# Patient Record
Sex: Male | Born: 1980 | Race: White | Hispanic: Yes | Marital: Married | Smoking: Never smoker
Health system: Southern US, Community
[De-identification: ages and names within clinical notes are randomized; demographics above are authoritative.]

## PROBLEM LIST (undated history)

## (undated) DIAGNOSIS — R7611 Nonspecific reaction to tuberculin skin test without active tuberculosis: Secondary | ICD-10-CM

## (undated) DIAGNOSIS — K219 Gastro-esophageal reflux disease without esophagitis: Secondary | ICD-10-CM

## (undated) DIAGNOSIS — T7840XA Allergy, unspecified, initial encounter: Secondary | ICD-10-CM

## (undated) HISTORY — DX: Allergy, unspecified, initial encounter: T78.40XA

## (undated) HISTORY — DX: Nonspecific reaction to tuberculin skin test without active tuberculosis: R76.11

## (undated) HISTORY — DX: Gastro-esophageal reflux disease without esophagitis: K21.9

---

## 2015-08-08 ENCOUNTER — Ambulatory Visit (INDEPENDENT_AMBULATORY_CARE_PROVIDER_SITE_OTHER): Payer: Managed Care, Other (non HMO) | Admitting: Family Medicine

## 2015-08-08 ENCOUNTER — Encounter: Payer: Self-pay | Admitting: Family Medicine

## 2015-08-08 VITALS — BP 122/74 | HR 76 | Temp 97.9°F | Ht 66.0 in | Wt 199.5 lb

## 2015-08-08 DIAGNOSIS — Z7189 Other specified counseling: Secondary | ICD-10-CM

## 2015-08-08 DIAGNOSIS — Z0000 Encounter for general adult medical examination without abnormal findings: Secondary | ICD-10-CM

## 2015-08-08 NOTE — Patient Instructions (Signed)
We'll get your old records.  Keep working on diet and exercise.  Let me know if you want to get an HIV screen or flu shot.  Take care.  Glad to see you.

## 2015-08-08 NOTE — Progress Notes (Addendum)
Pre visit review using our clinic review tool, if applicable. No additional management support is needed unless otherwise documented below in the visit note.  New patient.  Requesting records.  Feels well.  CPE- See plan.  Routine anticipatory guidance given to patient.  See health maintenance. Tetanus ~2012 per patient report Flu encouraged.  HIV screening encouraged. PNA and shingles not due Colon and prostate cancer screening not due Living will d/w pt.  Wife designated if patient were incapacitated.   Diet and exercise d/w pt. Doing well with both.  Encouraged.   Prev with lipids checked, await records.    PMH and SH reviewed  Meds, vitals, and allergies reviewed.   ROS: See HPI.  He has no complaints.  No FCNAV.  ROS Otherwise negative.    GEN: nad, alert and oriented HEENT: mucous membranes moist NECK: supple w/o LA CV: rrr. PULM: ctab, no inc wob ABD: soft, +bs EXT: no edema SKIN: no acute rash

## 2015-08-09 ENCOUNTER — Encounter: Payer: Self-pay | Admitting: Family Medicine

## 2015-08-09 DIAGNOSIS — Z0000 Encounter for general adult medical examination without abnormal findings: Secondary | ICD-10-CM | POA: Insufficient documentation

## 2015-08-09 DIAGNOSIS — Z7189 Other specified counseling: Secondary | ICD-10-CM | POA: Insufficient documentation

## 2015-08-09 NOTE — Assessment & Plan Note (Signed)
Tetanus ~2012 per patient report Flu encouraged.  HIV screening encouraged. PNA and shingles not due Colon and prostate cancer screening not due Living will d/w pt.  Wife designated if patient were incapacitated.   Diet and exercise d/w pt. Doing well with both.  Encouraged.   Prev with lipids checked, await records.

## 2016-07-30 ENCOUNTER — Ambulatory Visit (INDEPENDENT_AMBULATORY_CARE_PROVIDER_SITE_OTHER): Payer: Managed Care, Other (non HMO) | Admitting: Family Medicine

## 2016-07-30 ENCOUNTER — Encounter: Payer: Self-pay | Admitting: Family Medicine

## 2016-07-30 VITALS — BP 110/80 | HR 65 | Temp 98.5°F | Ht 66.0 in | Wt 190.5 lb

## 2016-07-30 DIAGNOSIS — Z131 Encounter for screening for diabetes mellitus: Secondary | ICD-10-CM

## 2016-07-30 DIAGNOSIS — Z8249 Family history of ischemic heart disease and other diseases of the circulatory system: Secondary | ICD-10-CM | POA: Diagnosis not present

## 2016-07-30 DIAGNOSIS — Z0000 Encounter for general adult medical examination without abnormal findings: Secondary | ICD-10-CM | POA: Diagnosis not present

## 2016-07-30 LAB — LIPID PANEL
CHOLESTEROL: 209 mg/dL — AB (ref 0–200)
HDL: 45.7 mg/dL (ref >39.00)
LDL Cholesterol: 140 mg/dL — ABNORMAL HIGH (ref 0–99)
NonHDL: 163.71
Total CHOL/HDL Ratio: 5
Triglycerides: 117 mg/dL (ref 0.0–149.0)
VLDL: 23.4 mg/dL (ref 0.0–40.0)

## 2016-07-30 LAB — GLUCOSE, RANDOM: GLUCOSE: 91 mg/dL (ref 70–99)

## 2016-07-30 NOTE — Progress Notes (Signed)
Pre visit review using our clinic review tool, if applicable. No additional management support is needed unless otherwise documented below in the visit note. 

## 2016-07-30 NOTE — Progress Notes (Signed)
CPE- See plan.  Routine anticipatory guidance given to patient.  See health maintenance. Tetanus ~2012 per patient report Flu encouraged.  To be done at work.  [facility] PNA and shingles not due Colon and prostate cancer screening not due Living will d/w pt.  Wife designated if patient were incapacitated.   Diet and exercise d/w pt. Doing well with both.  Encouraged both.   He has had some occ swelling on the L 5th finger w/o injury.  No bruising or redness prev.  No sx now.  He has h/o some joint laxity at baseline with baseline ability to dorsiflex 5th digits >90 deg, ie past vertical and h/o recurrently ankle sprains in childhood.  No hand sx currently.   PMH and SH reviewed  Meds, vitals, and allergies reviewed.   ROS: Per HPI.  Unless specifically indicated otherwise in HPI, the patient denies:  General: fever. Eyes: acute vision changes ENT: sore throat Cardiovascular: chest pain Respiratory: SOB GI: vomiting GU: dysuria Musculoskeletal: acute back pain Derm: acute rash Neuro: acute motor dysfunction Psych: worsening mood Endocrine: polydipsia Heme: bleeding Allergy: hayfever  GEN: nad, alert and oriented HEENT: mucous membranes moist NECK: supple w/o LA CV: rrr. PULM: ctab, no inc wob ABD: soft, +bs EXT: no edema SKIN: no acute rash L hand with normal inspection, no tendon deficit on testing ROM of the fingers.  Normal sensation.  No bruising, no swelling, no redness.  Hand/fingers not ttp

## 2016-07-30 NOTE — Patient Instructions (Signed)
Go to the lab on the way out.  We'll contact you with your lab report. Ask about getting a flu shot at work.  Take care.  Glad to see you.

## 2016-07-30 NOTE — Assessment & Plan Note (Signed)
Tetanus ~2012 per patient report Flu encouraged.  To be done at work.  [facility] screening prev done [facility] ~[facility] PNA and shingles not due Colon and prostate cancer screening not due Living will d/w pt.  Wife designated if patient were incapacitated.   Diet and exercise d/w pt. Doing well with both.  Encouraged both.  Reasonable to recheck lipid and sugar today, given FH CAD and diabetic screening respectively.  dw pt.  See notes on labs.    He may have some tendency to MCP or IP swelling vs irritation due to baseline joint/tendon laxity.  D/w pt.  W/o sx at this point, wouldn't intervene.  He can update me as needed.

## 2016-08-01 ENCOUNTER — Encounter: Payer: Self-pay | Admitting: *Deleted

## 2017-07-28 ENCOUNTER — Encounter: Payer: Self-pay | Admitting: Family Medicine

## 2017-07-28 ENCOUNTER — Ambulatory Visit (INDEPENDENT_AMBULATORY_CARE_PROVIDER_SITE_OTHER): Payer: Managed Care, Other (non HMO) | Admitting: Family Medicine

## 2017-07-28 VITALS — BP 122/80 | HR 67 | Temp 98.4°F | Ht 66.0 in | Wt 193.8 lb

## 2017-07-28 DIAGNOSIS — Z8249 Family history of ischemic heart disease and other diseases of the circulatory system: Secondary | ICD-10-CM | POA: Diagnosis not present

## 2017-07-28 DIAGNOSIS — Z0000 Encounter for general adult medical examination without abnormal findings: Secondary | ICD-10-CM

## 2017-07-28 DIAGNOSIS — Z131 Encounter for screening for diabetes mellitus: Secondary | ICD-10-CM

## 2017-07-28 DIAGNOSIS — Z7189 Other specified counseling: Secondary | ICD-10-CM

## 2017-07-28 LAB — GLUCOSE, RANDOM: GLUCOSE: 91 mg/dL (ref 70–99)

## 2017-07-28 LAB — LIPID PANEL
Cholesterol: 224 mg/dL — ABNORMAL HIGH (ref 0–200)
HDL: 38.9 mg/dL — AB (ref >39.00)
NONHDL: 185.55
Total CHOL/HDL Ratio: 6
Triglycerides: 228 mg/dL — ABNORMAL HIGH (ref 0.0–149.0)
VLDL: 45.6 mg/dL — AB (ref 0.0–40.0)

## 2017-07-28 LAB — LDL CHOLESTEROL, DIRECT: Direct LDL: 158 mg/dL

## 2017-07-28 NOTE — Progress Notes (Signed)
CPE- See plan.  Routine anticipatory guidance given to patient.  See health maintenance.  The possibility exists that previously documented standard health maintenance information may have been brought forward from a previous encounter into this note.  If needed, that same information has been updated to reflect the current situation based on today's encounter.    Tetanus ~2012 per patient report. Flu encouraged to be done at work.  [facility] screening prev done [facility] ~[facility] PNA and shingles not due Colon and prostate cancer screening not due Living will d/w pt. Wife designated if patient were incapacitated.  Diet and exercise d/w pt. Encouraged both.   Reasonable to recheck lipid and sugar today, given FH CAD and diabetic screening respectively.  dw pt.  See notes on labs.    PMH and SH reviewed  Meds, vitals, and allergies reviewed.   ROS: Per HPI.  Unless specifically indicated otherwise in HPI, the patient denies:  General: fever. Eyes: acute vision changes ENT: sore throat Cardiovascular: chest pain Respiratory: SOB GI: vomiting GU: dysuria Musculoskeletal: acute back pain Derm: acute rash Neuro: acute motor dysfunction Psych: worsening mood Endocrine: polydipsia Heme: bleeding Allergy: hayfever  GEN: nad, alert and oriented HEENT: mucous membranes moist NECK: supple w/o LA CV: rrr. PULM: ctab, no inc wob ABD: soft, +bs EXT: no edema SKIN: no acute rash, benign appearing nevi on the trunk.

## 2017-07-28 NOTE — Patient Instructions (Signed)
Go to the lab on the way out.  We'll contact you with your lab report. Get a flu shot at work.  Take care.  Glad to see you.  Update me as needed.  Keep working on diet and exercise.

## 2017-07-29 ENCOUNTER — Telehealth: Payer: Self-pay | Admitting: Family Medicine

## 2017-07-29 NOTE — Assessment & Plan Note (Signed)
Living will d/w pt.  Wife designated if patient were incapacitated.   ?

## 2017-07-29 NOTE — Telephone Encounter (Signed)
Notify pt.  Sugar is fine.  Needs work on D&E since lipids are up and recheck next year at/before CPE.  Thanks.

## 2017-07-29 NOTE — Assessment & Plan Note (Signed)
Tetanus ~2012 per patient report. Flu encouraged to be done at work.  [facility] done [facility] ~[facility] PNA and shingles not due Colon and prostate cancer screening not due Living will d/w pt. Wife designated if patient were incapacitated.  Diet and exercise d/w pt. Encouraged both.   Reasonable to recheck lipid and sugar today, given FH CAD and diabetic screening respectively.  dw pt.  See notes on labs.

## 2017-07-29 NOTE — Telephone Encounter (Signed)
VM has not been set up yet.  Try again later.

## 2017-08-01 NOTE — Telephone Encounter (Signed)
Patient advised.

## 2018-08-04 ENCOUNTER — Ambulatory Visit (INDEPENDENT_AMBULATORY_CARE_PROVIDER_SITE_OTHER): Payer: Managed Care, Other (non HMO) | Admitting: Family Medicine

## 2018-08-04 ENCOUNTER — Encounter: Payer: Self-pay | Admitting: Family Medicine

## 2018-08-04 VITALS — BP 118/84 | HR 70 | Temp 98.1°F | Ht 66.0 in | Wt 193.0 lb

## 2018-08-04 DIAGNOSIS — Z0000 Encounter for general adult medical examination without abnormal findings: Secondary | ICD-10-CM | POA: Diagnosis not present

## 2018-08-04 DIAGNOSIS — E785 Hyperlipidemia, unspecified: Secondary | ICD-10-CM

## 2018-08-04 DIAGNOSIS — Z7189 Other specified counseling: Secondary | ICD-10-CM

## 2018-08-04 DIAGNOSIS — Z131 Encounter for screening for diabetes mellitus: Secondary | ICD-10-CM | POA: Diagnosis not present

## 2018-08-04 LAB — LIPID PANEL
Cholesterol: 218 mg/dL — ABNORMAL HIGH (ref 0–200)
HDL: 38.4 mg/dL — AB (ref >39.00)
LDL Cholesterol: 140 mg/dL — ABNORMAL HIGH (ref 0–99)
NonHDL: 179.46
Total CHOL/HDL Ratio: 6
Triglycerides: 197 mg/dL — ABNORMAL HIGH (ref 0.0–149.0)
VLDL: 39.4 mg/dL (ref 0.0–40.0)

## 2018-08-04 LAB — GLUCOSE, RANDOM: Glucose, Bld: 97 mg/dL (ref 70–99)

## 2018-08-04 NOTE — Progress Notes (Signed)
CPE- See plan.  Routine anticipatory guidance given to patient.  See health maintenance.  The possibility exists that previously documented standard health maintenance information may have been brought forward from a previous encounter into this note.  If needed, that same information has been updated to reflect the current situation based on today's encounter.    Tetanus ~2012 per patient report. Flu encouraged to be done at work.  [facility] screening prev done [facility] ~[facility] PNA and shingles not due Colon and prostate cancer screening not due Living will d/w pt. Wife designated if patient were incapacitated.  Diet and exercise d/w pt.Encouraged both, d/w pt.    Reasonable to recheck lipid and sugar today, given FH CAD and diabetic screening respectively. dw pt. See notes on labs. h/o HLD noted.    Some heartburn noted, in the epigastrum.  He had occ sx.  D/w pt about diet and exercise, better with diet changes.  Exercise helps.  Discussed his work schedule, trying to maintain a healthy worklife balance.  PMH and SH reviewed  Meds, vitals, and allergies reviewed.   ROS: Per HPI.  Unless specifically indicated otherwise in HPI, the patient denies:  General: fever. Eyes: acute vision changes ENT: sore throat Cardiovascular: chest pain Respiratory: SOB GI: vomiting GU: dysuria Musculoskeletal: acute back pain Derm: acute rash Neuro: acute motor dysfunction Psych: worsening mood Endocrine: polydipsia Heme: bleeding Allergy: hayfever  GEN: nad, alert and oriented HEENT: mucous membranes moist NECK: supple w/o LA CV: rrr. PULM: ctab, no inc wob ABD: soft, +bs EXT: no edema SKIN: no acute rash

## 2018-08-04 NOTE — Patient Instructions (Addendum)
Go to the lab on the way out.  We'll contact you with your lab report. I would get a flu shot each fall.   Keep working on diet and exercise.  Take care.  Glad to see you.  Update me as needed.

## 2018-08-05 NOTE — Assessment & Plan Note (Signed)
Tetanus ~2012 per patient report. Flu encouraged to be done at work.  [facility] screening prev done [facility] ~[facility] PNA and shingles not due Colon and prostate cancer screening not due Living will d/w pt. Wife designated if patient were incapacitated.  Diet and exercise d/w pt.Encouraged both, d/w pt.    Reasonable to recheck lipid and sugar today, given FH CAD and diabetic screening respectively. dw pt. See notes on labs. h/o HLD noted.

## 2018-08-05 NOTE — Assessment & Plan Note (Signed)
Living will d/w pt.  Wife designated if patient were incapacitated.   ?

## 2019-08-04 ENCOUNTER — Other Ambulatory Visit: Payer: Self-pay | Admitting: Family Medicine

## 2019-08-04 ENCOUNTER — Telehealth: Payer: Self-pay

## 2019-08-04 DIAGNOSIS — E785 Hyperlipidemia, unspecified: Secondary | ICD-10-CM

## 2019-08-04 NOTE — Telephone Encounter (Signed)
LVM w COVID screen, front door, back lab info 12.2.2020 TLJ

## 2019-08-06 ENCOUNTER — Other Ambulatory Visit: Payer: Self-pay

## 2019-08-06 ENCOUNTER — Other Ambulatory Visit (INDEPENDENT_AMBULATORY_CARE_PROVIDER_SITE_OTHER): Payer: Managed Care, Other (non HMO)

## 2019-08-06 DIAGNOSIS — E785 Hyperlipidemia, unspecified: Secondary | ICD-10-CM | POA: Diagnosis not present

## 2019-08-06 LAB — LIPID PANEL
Cholesterol: 232 mg/dL — ABNORMAL HIGH (ref 0–200)
HDL: 42.9 mg/dL (ref >39.00)
LDL Cholesterol: 165 mg/dL — ABNORMAL HIGH (ref 0–99)
NonHDL: 188.76
Total CHOL/HDL Ratio: 5
Triglycerides: 117 mg/dL (ref 0.0–149.0)
VLDL: 23.4 mg/dL (ref 0.0–40.0)

## 2019-08-06 LAB — GLUCOSE, RANDOM: Glucose, Bld: 98 mg/dL (ref 70–99)

## 2019-08-12 ENCOUNTER — Encounter: Payer: Managed Care, Other (non HMO) | Admitting: Family Medicine

## 2019-08-19 ENCOUNTER — Ambulatory Visit (INDEPENDENT_AMBULATORY_CARE_PROVIDER_SITE_OTHER): Payer: Managed Care, Other (non HMO) | Admitting: Family Medicine

## 2019-08-19 ENCOUNTER — Other Ambulatory Visit: Payer: Self-pay

## 2019-08-19 ENCOUNTER — Encounter: Payer: Self-pay | Admitting: Family Medicine

## 2019-08-19 VITALS — BP 102/70 | HR 84 | Temp 97.1°F | Ht 66.0 in | Wt 189.4 lb

## 2019-08-19 DIAGNOSIS — Z7189 Other specified counseling: Secondary | ICD-10-CM

## 2019-08-19 DIAGNOSIS — Z0000 Encounter for general adult medical examination without abnormal findings: Secondary | ICD-10-CM | POA: Diagnosis not present

## 2019-08-19 NOTE — Progress Notes (Signed)
This visit occurred during the SARS-CoV-2 public health emergency.  Safety protocols were in place, including screening questions prior to the visit, additional usage of staff PPE, and extensive cleaning of exam room while observing appropriate contact time as indicated for disinfecting solutions.  CPE- See plan.  Routine anticipatory guidance given to patient.  See health maintenance.  The possibility exists that previously documented standard health maintenance information may have been brought forward from a previous encounter into this note.  If needed, that same information has been updated to reflect the current situation based on today's encounter.    Tetanus ~2012 per patient report. Flu encouraged, can be done at work.  Declined.  He does not have a medical reason not to get vaccinated for the flu and it would help protect him and his family.  Discussed. [facility] ~[facility] PNA and shingles not due Colon and prostate cancer screening not due Living will d/w pt. Wife designated if patient were incapacitated.  Diet and exercise d/w pt. Labs d/w pt.   He had some occ heartburn and RUQ discomfort rarely (a few times a year) with fatty meals but not always.  No sx currently.  We talked about options.    PMH and SH reviewed Meds, vitals, and allergies reviewed.   ROS: Per HPI.  Unless specifically indicated otherwise in HPI, the patient denies:  General: fever. Eyes: acute vision changes ENT: sore throat Cardiovascular: chest pain Respiratory: SOB GI: vomiting GU: dysuria Musculoskeletal: acute back pain Derm: acute rash Neuro: acute motor dysfunction Psych: worsening mood Endocrine: polydipsia Heme: bleeding Allergy: hayfever  GEN: nad, alert and oriented HEENT: ncat NECK: supple w/o LA CV: rrr. PULM: ctab, no inc wob ABD: soft, +bs EXT: no edema SKIN: no acute rash

## 2019-08-19 NOTE — Patient Instructions (Signed)
If you keep having abdominal pain then let me know so we can considering setting up an ultrasound.   I would get a flu shot each fall.   Take care.  Glad to see you.  Update me as needed.

## 2019-08-22 NOTE — Assessment & Plan Note (Signed)
Living will d/w pt.  Wife designated if patient were incapacitated.   ?

## 2019-08-22 NOTE — Assessment & Plan Note (Addendum)
Tetanus ~2012 per patient report. Flu encouraged, can be done at work.  Declined.  He does not have a medical reason not to get vaccinated for the flu and it would help protect him and his family.  Discussed. [facility] ~[facility] PNA and shingles not due Colon and prostate cancer screening not due Living will d/w pt. Wife designated if patient were incapacitated.  Diet and exercise d/w pt. Labs d/w pt.   We talked about his estimated ASCVD score. We can't run his typical score at his age, but assuming his current metrics but using an age of 21 to get a result, his score would be ~1%.  We both assume him to be low risk at this point, d/w pt.    He had some occ heartburn and RUQ discomfort rarely (a few times a year) with fatty meals but not always.  No sx currently.  We talked about options.  I would have him observe for now and update me if he notices recurrent symptoms.  Routine cautions given.  He agrees.  We could set up an ultrasound as an outpatient if needed.

## 2020-09-10 ENCOUNTER — Other Ambulatory Visit: Payer: Self-pay

## 2020-09-10 DIAGNOSIS — Z20822 Contact with and (suspected) exposure to covid-19: Secondary | ICD-10-CM | POA: Diagnosis not present

## 2020-09-12 LAB — SARS-COV-2, NAA 2 DAY TAT

## 2020-09-12 LAB — NOVEL CORONAVIRUS, NAA: SARS-CoV-2, NAA: DETECTED — AB

## 2020-10-02 ENCOUNTER — Encounter: Payer: Self-pay | Admitting: Family Medicine

## 2020-10-02 ENCOUNTER — Ambulatory Visit (INDEPENDENT_AMBULATORY_CARE_PROVIDER_SITE_OTHER): Payer: BC Managed Care – PPO | Admitting: Family Medicine

## 2020-10-02 ENCOUNTER — Other Ambulatory Visit: Payer: Self-pay

## 2020-10-02 VITALS — BP 104/80 | HR 79 | Temp 97.5°F | Ht 66.0 in | Wt 190.0 lb

## 2020-10-02 DIAGNOSIS — R1011 Right upper quadrant pain: Secondary | ICD-10-CM | POA: Diagnosis not present

## 2020-10-02 DIAGNOSIS — E785 Hyperlipidemia, unspecified: Secondary | ICD-10-CM

## 2020-10-02 DIAGNOSIS — Z7189 Other specified counseling: Secondary | ICD-10-CM

## 2020-10-02 DIAGNOSIS — Z0000 Encounter for general adult medical examination without abnormal findings: Secondary | ICD-10-CM | POA: Diagnosis not present

## 2020-10-02 NOTE — Patient Instructions (Signed)
Go to the lab on the way out.   If you have mychart we'll likely use that to update you.    We'll call about the ultrasound.   I would still avoid greasy/fatty foods.  Take care.  Glad to see you.

## 2020-10-02 NOTE — Progress Notes (Signed)
This visit occurred during the SARS-CoV-2 public health emergency.  Safety protocols were in place, including screening questions prior to the visit, additional usage of staff PPE, and extensive cleaning of exam room while observing appropriate contact time as indicated for disinfecting solutions.  CPE- See plan.  Routine anticipatory guidance given to patient.  See health maintenance.  The possibility exists that previously documented standard health maintenance information may have been brought forward from a previous encounter into this note.  If needed, that same information has been updated to reflect the current situation based on today's encounter.    Tetanus ~2012 per patient report. Flu encouraged.  Discussed. Covid prev done.   [facility]/HCV screening [facility] ~[facility] PNA and shingles not due Colon and prostate cancer screening not due Living will d/w pt. Wife designated if patient were incapacitated.  Diet and exercise d/w pt.  Abdominal symptoms.  No recent sx, last episode in 12/21.  RUQ.  Fatigued after the event. Can be noted after a fatty meal but not everytime after fatty meal.  We had talked about this prev with consided u/s vs observation.  Pain isn't constant, no pain now.    PMH and SH reviewed  Meds, vitals, and allergies reviewed.   ROS: Per HPI.  Unless specifically indicated otherwise in HPI, the patient denies:  General: fever. Eyes: acute vision changes ENT: sore throat Cardiovascular: chest pain Respiratory: SOB GI: vomiting GU: dysuria Musculoskeletal: acute back pain Derm: acute rash Neuro: acute motor dysfunction Psych: worsening mood Endocrine: polydipsia Heme: bleeding Allergy: hayfever  GEN: nad, alert and oriented HEENT: ncat NECK: supple w/o LA CV: rrr. PULM: ctab, no inc wob ABD: soft, +bs, not tender to palpation.  Murphy negative.  No rebound. EXT: no edema SKIN: no acute rash

## 2020-10-03 LAB — CBC WITH DIFFERENTIAL/PLATELET
Basophils Absolute: 0 10*3/uL (ref 0.0–0.1)
Basophils Relative: 0.5 % (ref 0.0–3.0)
Eosinophils Absolute: 0.2 10*3/uL (ref 0.0–0.7)
Eosinophils Relative: 3.2 % (ref 0.0–5.0)
HCT: 44 % (ref 39.0–52.0)
Hemoglobin: 15.2 g/dL (ref 13.0–17.0)
Lymphocytes Relative: 30.9 % (ref 12.0–46.0)
Lymphs Abs: 2.1 10*3/uL (ref 0.7–4.0)
MCHC: 34.4 g/dL (ref 30.0–36.0)
MCV: 86 fl (ref 78.0–100.0)
Monocytes Absolute: 0.6 10*3/uL (ref 0.1–1.0)
Monocytes Relative: 8.5 % (ref 3.0–12.0)
Neutro Abs: 3.9 10*3/uL (ref 1.4–7.7)
Neutrophils Relative %: 56.9 % (ref 43.0–77.0)
Platelets: 255 10*3/uL (ref 150.0–400.0)
RBC: 5.12 Mil/uL (ref 4.22–5.81)
RDW: 12.9 % (ref 11.5–15.5)
WBC: 6.9 10*3/uL (ref 4.0–10.5)

## 2020-10-03 LAB — COMPREHENSIVE METABOLIC PANEL
ALT: 25 U/L (ref 0–53)
AST: 21 U/L (ref 0–37)
Albumin: 4.5 g/dL (ref 3.5–5.2)
Alkaline Phosphatase: 85 U/L (ref 39–117)
BUN: 14 mg/dL (ref 6–23)
CO2: 29 mEq/L (ref 19–32)
Calcium: 9.6 mg/dL (ref 8.4–10.5)
Chloride: 101 mEq/L (ref 96–112)
Creatinine, Ser: 1.07 mg/dL (ref 0.40–1.50)
GFR: 87.77 mL/min (ref >60.00)
Glucose, Bld: 80 mg/dL (ref 70–99)
Potassium: 3.9 mEq/L (ref 3.5–5.1)
Sodium: 137 mEq/L (ref 135–145)
Total Bilirubin: 0.9 mg/dL (ref 0.2–1.2)
Total Protein: 7.3 g/dL (ref 6.0–8.3)

## 2020-10-03 LAB — LIPID PANEL
Cholesterol: 209 mg/dL — ABNORMAL HIGH (ref 0–200)
HDL: 43.4 mg/dL (ref >39.00)
LDL Cholesterol: 142 mg/dL — ABNORMAL HIGH (ref 0–99)
NonHDL: 165.46
Total CHOL/HDL Ratio: 5
Triglycerides: 115 mg/dL (ref 0.0–149.0)
VLDL: 23 mg/dL (ref 0.0–40.0)

## 2020-10-03 LAB — LIPASE: Lipase: 44 U/L (ref 11.0–59.0)

## 2020-10-04 DIAGNOSIS — R1011 Right upper quadrant pain: Secondary | ICD-10-CM | POA: Insufficient documentation

## 2020-10-04 NOTE — Assessment & Plan Note (Signed)
Living will d/w pt.  Wife designated if patient were incapacitated.   ?

## 2020-10-04 NOTE — Assessment & Plan Note (Signed)
Concern is for gallbladder pathology causing intermittent symptoms.  Would avoid fatty meals in the meantime.  Routine cautions given to patient.  Check routine labs today and we will set up right upper quadrant ultrasound.  Okay for outpatient follow-up.

## 2020-10-04 NOTE — Assessment & Plan Note (Signed)
Tetanus ~2012 per patient report. Flu encouraged.  Discussed. Covid prev done.   HIV/HCV screening [facility] done [facility] ~[facility] PNA and shingles not due Colon and prostate cancer screening not due Living will d/w pt. Wife designated if patient were incapacitated.  Diet and exercise d/w pt.

## 2020-10-10 ENCOUNTER — Other Ambulatory Visit: Payer: BC Managed Care – PPO

## 2020-10-13 ENCOUNTER — Other Ambulatory Visit: Payer: Self-pay | Admitting: Family Medicine

## 2020-10-13 ENCOUNTER — Ambulatory Visit
Admission: RE | Admit: 2020-10-13 | Discharge: 2020-10-13 | Disposition: A | Discharge status: Home / Self Care | Payer: BC Managed Care – PPO | Source: Ambulatory Visit | Attending: Family Medicine | Admitting: Family Medicine

## 2020-10-13 ENCOUNTER — Other Ambulatory Visit: Payer: Self-pay

## 2020-10-13 DIAGNOSIS — R1011 Right upper quadrant pain: Secondary | ICD-10-CM

## 2020-10-13 DIAGNOSIS — R109 Unspecified abdominal pain: Secondary | ICD-10-CM | POA: Diagnosis not present

## 2020-10-15 NOTE — Progress Notes (Addendum)
See below regarding order. I will defer to patient.  Antonio Watson   ============= Hi Dr Damita Dunnings,   I spoke with patient today in regards to the scan for gallbladder. Patient said he wanted to wait on this. He will work on eating better and re visit this maybe in the summer if needed.  I advised patient order will be cancelled and just to keep [place] update as needed.   FYI  Thank you  Anastasiya

## 2021-09-16 ENCOUNTER — Other Ambulatory Visit: Payer: Self-pay | Admitting: Family Medicine

## 2021-09-16 DIAGNOSIS — E785 Hyperlipidemia, unspecified: Secondary | ICD-10-CM

## 2021-09-24 ENCOUNTER — Ambulatory Visit (INDEPENDENT_AMBULATORY_CARE_PROVIDER_SITE_OTHER): Payer: Managed Care, Other (non HMO) | Admitting: Dermatology

## 2021-09-24 ENCOUNTER — Other Ambulatory Visit: Payer: Self-pay

## 2021-09-24 DIAGNOSIS — D229 Melanocytic nevi, unspecified: Secondary | ICD-10-CM

## 2021-09-24 DIAGNOSIS — D224 Melanocytic nevi of scalp and neck: Secondary | ICD-10-CM | POA: Diagnosis not present

## 2021-09-24 DIAGNOSIS — Z1283 Encounter for screening for malignant neoplasm of skin: Secondary | ICD-10-CM

## 2021-09-24 DIAGNOSIS — D225 Melanocytic nevi of trunk: Secondary | ICD-10-CM

## 2021-09-24 DIAGNOSIS — D239 Other benign neoplasm of skin, unspecified: Secondary | ICD-10-CM

## 2021-09-24 DIAGNOSIS — D489 Neoplasm of uncertain behavior, unspecified: Secondary | ICD-10-CM

## 2021-09-24 HISTORY — DX: Other benign neoplasm of skin, unspecified: D23.9

## 2021-09-24 NOTE — Patient Instructions (Addendum)
Biopsy Wound Care Instructions  Leave the original bandage on for 24 hours if possible.  If the bandage becomes soaked or soiled before that time, it is OK to remove it and examine the wound.  A small amount of post-operative bleeding is normal.  If excessive bleeding occurs, remove the bandage, place gauze over the site and apply continuous pressure (no peeking) over the area for 30 minutes. If this does not work, please call our clinic as soon as possible or page your doctor if it is after hours.   Once a day, cleanse the wound with soap and water. It is fine to shower. If a thick crust develops you may use a Q-tip dipped into dilute hydrogen peroxide (mix 1:1 with water) to dissolve it.  Hydrogen peroxide can slow the healing process, so use it only as needed.    After washing, apply petroleum jelly (Vaseline) or an antibiotic ointment if your doctor prescribed one for you, followed by a bandage.    For best healing, the wound should be covered with a layer of ointment at all times. If you are not able to keep the area covered with a bandage to hold the ointment in place, this may mean re-applying the ointment several times a day.  Continue this wound care until the wound has healed and is no longer open.   Itching and mild discomfort is normal during the healing process. However, if you develop pain or severe itching, please call our office.   If you have any discomfort, you can take Tylenol (acetaminophen) or ibuprofen as directed on the bottle. (Please do not take these if you have an allergy to them or cannot take them for another reason).  Some redness, tenderness and white or yellow material in the wound is normal healing.  If the area becomes very sore and red, or develops a thick yellow-green material (pus), it may be infected; please notify us.    If you have stitches, return to clinic as directed to have the stitches removed. You will continue wound care for 2-3 days after the stitches  are removed.   Wound healing continues for up to one year following surgery. It is not unusual to experience pain in the scar from time to time during the interval.  If the pain becomes severe or the scar thickens, you should notify the office.    A slight amount of redness in a scar is expected for the first six months.  After six months, the redness will fade and the scar will soften and fade.  The color difference becomes less noticeable with time.  If there are any problems, return for a post-op surgery check at your earliest convenience.  To improve the appearance of the scar, you can use silicone scar gel, cream, or sheets (such as Mederma or Serica) every night for up to one year. These are available over the counter (without a prescription).  Please call our office at [phone] for any questions or concerns.     If You Need Anything After Your Visit  If you have any questions or concerns for your doctor, please call our main line at [phone] and press option 4 to reach your doctor's medical assistant. If no one answers, please leave a voicemail as directed and we will return your call as soon as possible. Messages left after 4 pm will be answered the following business day.   You may also send [place] a message via [place]. We typically respond  to MyChart messages within 1-2 business days.  For prescription refills, please ask your pharmacy to contact our office. Our fax number is [phone].  If you have an urgent issue when the clinic is closed that cannot wait until the next business day, you can page your doctor at the number below.    Please note that while we do our best to be available for urgent issues outside of office hours, we are not available 24/7.   If you have an urgent issue and are unable to reach [place], you may choose to seek medical care at your doctor's office, retail clinic, urgent care center, or emergency room.  If you have a medical emergency, please  immediately call 911 or go to the emergency department.  Pager Numbers  - Dr. Nehemiah Massed: [phone]  - Dr. Laurence Ferrari: [phone]  - Dr. Nicole Kindred: [phone]  In the event of inclement weather, please call our main line at [phone] for an update on the status of any delays or closures.  Dermatology Medication Tips: Please keep the boxes that topical medications come in in order to help keep track of the instructions about where and how to use these. Pharmacies typically print the medication instructions only on the boxes and not directly on the medication tubes.   If your medication is too expensive, please contact our office at [phone] option 4 or send [place] a message through [place].   We are unable to tell what your co-pay for medications will be in advance as this is different depending on your insurance coverage. However, we may be able to find a substitute medication at lower cost or fill out paperwork to get insurance to cover a needed medication.   If a prior authorization is required to get your medication covered by your insurance company, please allow [place] 1-2 business days to complete this process.  Drug prices often vary depending on where the prescription is filled and some pharmacies may offer cheaper prices.  The website www.goodrx.com contains coupons for medications through different pharmacies. The prices here do not account for what the cost may be with help from insurance (it may be cheaper with your insurance), but the website can give you the price if you did not use any insurance.  - You can print the associated coupon and take it with your prescription to the pharmacy.  - You may also stop by our office during regular business hours and pick up a GoodRx coupon card.  - If you need your prescription sent electronically to a different pharmacy, notify our office through [facility] or by phone at [phone] option 4.     Si Usted Necesita Algo Despus  de Su Visita  Tambin puede enviarnos un mensaje a travs de Pharmacist, community. Por lo general respondemos a los mensajes de MyChart en el transcurso de 1 a 2 das hbiles.  Para renovar recetas, por favor pida a su farmacia que se ponga en contacto con nuestra oficina. Harland Dingwall de fax es [place] [phone].  Si tiene un asunto urgente cuando la clnica est cerrada y que no puede esperar hasta el siguiente da hbil, puede llamar/localizar a su doctor(a) al nmero que aparece a continuacin.   Por favor, tenga en cuenta que aunque hacemos todo lo posible para estar disponibles para asuntos urgentes fuera del horario de [place], no estamos disponibles las 24 horas del da, los 7 das de la [place].   Si tiene un problema urgente y no puede comunicarse con nosotros,  puede optar por buscar atencin mdica  en el consultorio de su doctor(a), en una clnica privada, en un centro de atencin urgente o en una sala de emergencias.  Si tiene Engineering geologist, por favor llame inmediatamente al 911 o vaya a la sala de emergencias.  Nmeros de bper  - Dr. Nehemiah Massed: [phone]  - Dra. Moye: [phone]  - Dra. Nicole Kindred: [phone]  En caso de inclemencias del [place], por favor llame a Johnsie Kindred principal al [phone] para una actualizacin sobre el [place] de cualquier retraso o cierre.  Consejos para la medicacin en dermatologa: Por favor, guarde las cajas en las que vienen los medicamentos de uso tpico para ayudarle a seguir las instrucciones sobre dnde y cmo usarlos. Las farmacias generalmente imprimen las instrucciones del medicamento slo en las cajas y no directamente en los tubos del [place].   Si su medicamento es muy caro, por favor, pngase en contacto con Zigmund Daniel llamando al [phone] y presione la opcin 4 o envenos un mensaje a travs de Pharmacist, community.   No podemos decirle cul ser su copago por los medicamentos por adelantado ya que esto es diferente dependiendo  de la cobertura de su seguro. Sin embargo, es posible que podamos encontrar un medicamento sustituto a Electrical engineer un formulario para que el seguro cubra el medicamento que se considera necesario.   Si se requiere una autorizacin previa para que su compaa de seguros [place] su medicamento, por favor permtanos de 1 a 2 das hbiles para completar este proceso.  Los precios de los medicamentos varan con frecuencia dependiendo del Environmental consultant de dnde se surte la receta y alguna farmacias pueden ofrecer precios ms baratos.  El sitio web www.goodrx.com tiene cupones para medicamentos de Airline pilot. Los precios aqu no tienen en cuenta lo que podra costar con la ayuda del seguro (puede ser ms barato con su seguro), pero el sitio web puede darle el precio si no utiliz Research scientist (physical sciences).  - Puede imprimir el cupn correspondiente y llevarlo con su receta a la farmacia.  - Tambin puede pasar por nuestra oficina durante el horario de atencin regular y Charity fundraiser una tarjeta de cupones de GoodRx.  - Si necesita que su receta se enve electrnicamente a una farmacia diferente, informe a nuestra oficina a travs de MyChart de  o por telfono llamando al [phone] y presione la opcin 4.

## 2021-09-24 NOTE — Progress Notes (Signed)
New Patient Visit  Subjective  Antonio Watson is a 41 y.o. male who presents for the following: New Patient (Initial Visit) (Patient here today concerning a mole at scalp and some places on back. Patient denies history or family history of skin cancer. ). The patient presents for Upper Body Skin Exam (UBSE) for skin cancer screening and mole check.  The patient has spots, moles and lesions to be evaluated, some may be new or changing and the patient has concerns that these could be cancer.  The following portions of the chart were reviewed this encounter and updated as appropriate:   Tobacco   Allergies   Meds   Problems   Med Hx   Surg Hx   Fam Hx      Review of Systems:  No other skin or systemic complaints except as noted in HPI or Assessment and Plan.  Objective  Well appearing patient in no apparent distress; mood and affect are within normal limits.  A focused examination was performed including back, scalp, arms , chest . Relevant physical exam findings are noted in the Assessment and Plan.  Skin waist up examined.  left mid to upper back 4 cm lateral  to spine 0.6 cm irregular brown papule          Left lower back 9 cm lateral to spine 0.6 cm irregular brown papule          right crown scalp 0.9 cm  irregular flesh colored papule         Assessment & Plan  Neoplasm of uncertain behavior (3) left mid to upper back 4 cm lateral  to spine Skin / nail biopsy Type of biopsy: tangential   Informed consent: discussed and consent obtained   Timeout: patient name, date of birth, surgical site, and procedure verified   Procedure prep:  Patient was prepped and draped in usual sterile fashion Prep type:  Isopropyl alcohol Anesthesia: the lesion was anesthetized in a standard fashion   Anesthetic:  1% lidocaine w/ epinephrine 1-100,000 buffered w/ 8.4% NaHCO3 Instrument used: flexible razor blade   Hemostasis achieved with: pressure, aluminum chloride and  electrodesiccation   Outcome: patient tolerated procedure well   Post-procedure details: sterile dressing applied and wound care instructions given   Dressing type: petrolatum and bandage    Specimen 1 - Surgical pathology Differential Diagnosis: irregular nevus vs dysplasia  Biopsy  Check Margins: No  Left lower back 9 cm lateral to spine Skin / nail biopsy Type of biopsy: tangential   Informed consent: discussed and consent obtained   Timeout: patient name, date of birth, surgical site, and procedure verified   Procedure prep:  Patient was prepped and draped in usual sterile fashion Prep type:  Isopropyl alcohol Anesthesia: the lesion was anesthetized in a standard fashion   Anesthetic:  1% lidocaine w/ epinephrine 1-100,000 buffered w/ 8.4% NaHCO3 Instrument used: flexible razor blade   Hemostasis achieved with: pressure, aluminum chloride and electrodesiccation   Outcome: patient tolerated procedure well   Post-procedure details: sterile dressing applied and wound care instructions given   Dressing type: petrolatum and bandage    Specimen 2 - Surgical pathology Differential Diagnosis: irregular nevus vs dysplasia  Check Margins: No  right crown scalp Skin / nail biopsy Type of biopsy: tangential   Informed consent: discussed and consent obtained   Timeout: patient name, date of birth, surgical site, and procedure verified   Procedure prep:  Patient was prepped and draped in usual sterile  fashion Prep type:  Isopropyl alcohol Anesthesia: the lesion was anesthetized in a standard fashion   Anesthetic:  1% lidocaine w/ epinephrine 1-100,000 buffered w/ 8.4% NaHCO3 Instrument used: flexible razor blade   Hemostasis achieved with: pressure, aluminum chloride and electrodesiccation   Outcome: patient tolerated procedure well   Post-procedure details: sterile dressing applied and wound care instructions given   Dressing type: petrolatum and bandage    Specimen 3 - Surgical  pathology Differential Diagnosis: traumatized irregular nevus vs dysplasia  Check Margins: No traumatized nevus vs dysplasia   Skin cancer screening  Melanocytic Nevi - Tan-brown and/or pink-flesh-colored symmetric macules and papules - Benign appearing on exam today - Observation - Call clinic for new or changing moles - Recommend daily use of broad spectrum spf 30+ sunscreen to sun-exposed areas.   Return if symptoms worsen or fail to improve.  IRuthell Rummage, CMA, am acting as scribe for Sarina Ser, MD. Documentation: I have reviewed the above documentation for accuracy and completeness, and I agree with the above.  Sarina Ser, MD

## 2021-09-26 ENCOUNTER — Encounter: Payer: Self-pay | Admitting: Dermatology

## 2021-09-28 ENCOUNTER — Telehealth: Payer: Self-pay

## 2021-09-28 ENCOUNTER — Other Ambulatory Visit: Payer: BC Managed Care – PPO

## 2021-09-28 NOTE — Telephone Encounter (Signed)
Patient informed of pathology results 

## 2021-09-28 NOTE — Telephone Encounter (Signed)
-----   Message from Ralene Bathe, MD sent at 09/25/2021  7:10 PM EST ----- Diagnosis 1. Skin , left mid to upper back 4cm lateral to spine MELANOCYTIC NEVUS, INTRADERMAL TYPE, BASE INVOLVED 2. Skin , left lower back 9 cm lateral to spine DYSPLASTIC JUNCTIONAL LENTIGINOUS NEVUS WITH MODERATE ATYPIA, CLOSE TO MARGIN 3. Skin , right crown scalp MELANOCYTIC NEVUS WITH SCAR, BASE INVOLVED, SEE DESCRIPTION  1&3 - both benign mole No further treatment needed 2- Moderate dysplastic Recheck  1 year (Make pt appt for 1 year)

## 2021-10-05 ENCOUNTER — Encounter: Payer: BC Managed Care – PPO | Admitting: Family Medicine

## 2021-11-19 ENCOUNTER — Other Ambulatory Visit: Payer: Self-pay

## 2021-11-19 ENCOUNTER — Other Ambulatory Visit (INDEPENDENT_AMBULATORY_CARE_PROVIDER_SITE_OTHER): Payer: Managed Care, Other (non HMO)

## 2021-11-19 DIAGNOSIS — E785 Hyperlipidemia, unspecified: Secondary | ICD-10-CM

## 2021-11-19 LAB — LIPID PANEL
Cholesterol: 205 mg/dL — ABNORMAL HIGH (ref 0–200)
HDL: 40.9 mg/dL (ref >39.00)
LDL Cholesterol: 131 mg/dL — ABNORMAL HIGH (ref 0–99)
NonHDL: 163.84
Total CHOL/HDL Ratio: 5
Triglycerides: 163 mg/dL — ABNORMAL HIGH (ref 0.0–149.0)
VLDL: 32.6 mg/dL (ref 0.0–40.0)

## 2021-11-19 LAB — GLUCOSE, RANDOM: Glucose, Bld: 84 mg/dL (ref 70–99)

## 2021-11-26 ENCOUNTER — Ambulatory Visit (INDEPENDENT_AMBULATORY_CARE_PROVIDER_SITE_OTHER): Payer: Managed Care, Other (non HMO) | Admitting: Family Medicine

## 2021-11-26 ENCOUNTER — Encounter: Payer: Self-pay | Admitting: Family Medicine

## 2021-11-26 ENCOUNTER — Other Ambulatory Visit: Payer: BC Managed Care – PPO

## 2021-11-26 ENCOUNTER — Other Ambulatory Visit: Payer: Self-pay

## 2021-11-26 VITALS — BP 128/80 | HR 97 | Temp 97.9°F | Ht 66.0 in | Wt 199.0 lb

## 2021-11-26 DIAGNOSIS — Z0000 Encounter for general adult medical examination without abnormal findings: Secondary | ICD-10-CM

## 2021-11-26 DIAGNOSIS — Z7189 Other specified counseling: Secondary | ICD-10-CM

## 2021-11-26 DIAGNOSIS — Z23 Encounter for immunization: Secondary | ICD-10-CM | POA: Diagnosis not present

## 2021-11-26 DIAGNOSIS — R1011 Right upper quadrant pain: Secondary | ICD-10-CM

## 2021-11-26 MED ORDER — OMEPRAZOLE 20 MG PO CPDR: 20.0000 mg | DELAYED_RELEASE_CAPSULE | Freq: Every day | ORAL | Status: AC | PRN

## 2021-11-26 NOTE — Patient Instructions (Signed)
Tetanus shot today.  ?Update me as needed, if more abdominal pain.  ?Take care.  Glad to see you. ?

## 2021-11-26 NOTE — Progress Notes (Signed)
This visit occurred during the SARS-CoV-2 public health emergency.  Safety protocols were in place, including screening questions prior to the visit, additional usage of staff PPE, and extensive cleaning of exam room while observing appropriate contact time as indicated for disinfecting solutions. ? ?CPE- See plan.  Routine anticipatory guidance given to patient.  See health maintenance.  The possibility exists that previously documented standard health maintenance information may have been brought forward from a previous encounter into this note.  If needed, that same information has been updated to reflect the current situation based on today's encounter.   ? ?Tetanus 2023 ?Flu encouraged.  Discussed. ?Covid prev done.   ?[facility] ?PNA and shingles not due ?Colon and prostate cancer screening not due. ?Living will d/w pt.  Wife designated if patient were incapacitated.   ?Diet and exercise d/w pt.  ? ?The 10-year ASCVD risk score (Arnett DK, et al., 2019) is: 1.6% ?  Values used to calculate the score: ?    Age: 41 years ?    Sex: Male ?    Is Non-Hispanic African American: No ?    Diabetic: No ?    Tobacco smoker: No ?    Systolic Blood Pressure: 940 mmHg ?    Is BP treated: No ?    HDL Cholesterol: 40.9 mg/dL ?    Total Cholesterol: 205 mg/dL ? ?Occ prn use of PPI with trigger food anticipation.  No ADE on med.  Not daily use.  No RUQ pain now.  No sx as is.   ? ?He'll have routine dermatology f/u.   ? ?PMH and SH reviewed ? ?Meds, vitals, and allergies reviewed.  ? ?ROS: Per HPI.  Unless specifically indicated otherwise in HPI, the patient denies: ? ?General: fever. ?Eyes: acute vision changes ?ENT: sore throat ?Cardiovascular: chest pain ?Respiratory: SOB ?GI: vomiting ?GU: dysuria ?Musculoskeletal: acute back pain ?Derm: acute rash ?Neuro: acute motor dysfunction ?Psych: worsening mood ?Endocrine: polydipsia ?Heme: bleeding ?Allergy: hayfever ? ?GEN: nad, alert and  oriented ?HEENT: ncat ?NECK: supple w/o LA ?CV: rrr. ?PULM: ctab, no inc wob ?ABD: soft, +bs, nontender to palpation ?EXT: no edema ?SKIN: no acute rash ?

## 2021-11-28 NOTE — Assessment & Plan Note (Signed)
Occ prn use of PPI with trigger food anticipation.  No ADE on med.  Not daily use.  No RUQ pain now.  No sx as is.   Discussed with patient about options.  Would continue Prilosec as needed as is.  Benign abdominal exam. ?

## 2021-11-28 NOTE — Assessment & Plan Note (Signed)
Tetanus 2023 ?Flu encouraged.  Discussed. ?Covid prev done.   ?[facility]CV [facility] ?PNA and shingles not due ?Colon and prostate cancer screening not due. ?Living will d/w pt.  Wife designated if patient were incapacitated.   ?Diet and exercise d/w pt.  ?

## 2021-11-28 NOTE — Assessment & Plan Note (Signed)
Living will d/w pt.  Wife designated if patient were incapacitated.   ?

## 2021-11-29 ENCOUNTER — Encounter: Payer: Self-pay | Admitting: Family Medicine

## 2022-06-11 IMAGING — US US ABDOMEN LIMITED RUQ/ASCITES
1 series · 14 of 25 positions shown · non-contrast
Comparison: None.

CLINICAL DATA: Chronic right upper quadrant abdominal pain

EXAM:
ULTRASOUND ABDOMEN LIMITED RIGHT UPPER QUADRANT

[Series 1: us abdomen limited ruq/ascites · 0.20mm/px · 14 of 42 slices shown]
[im 1/42]
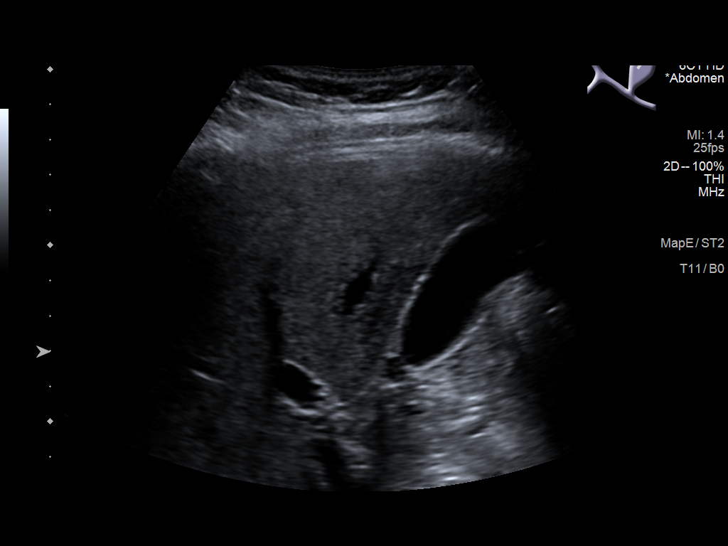
[im 4/42]
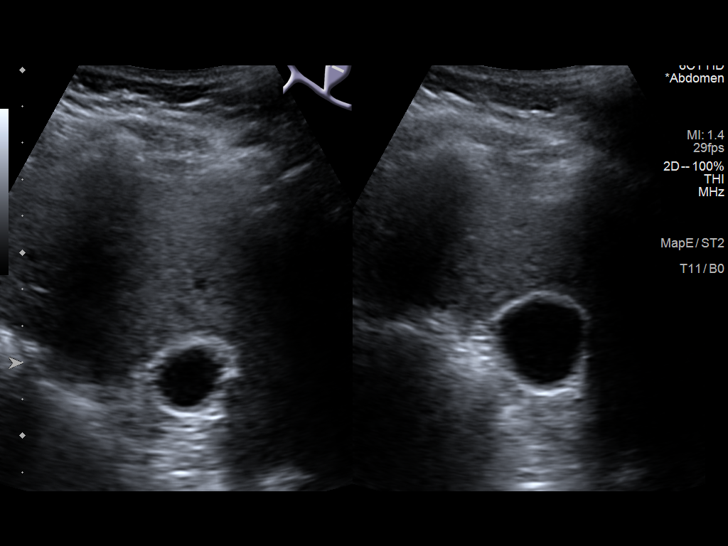
[im 7/42]
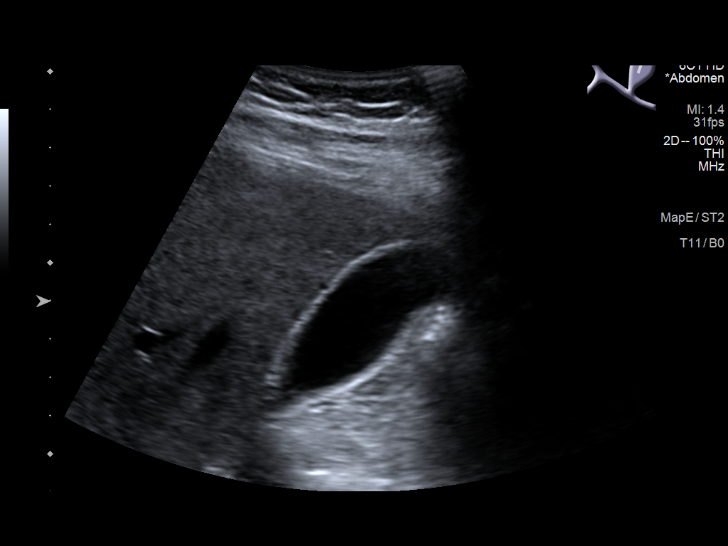
[im 11/42]
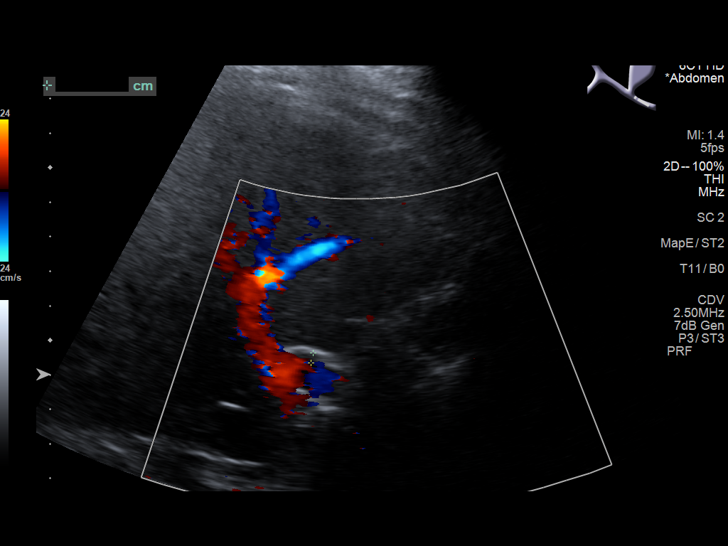
[im 14/42]
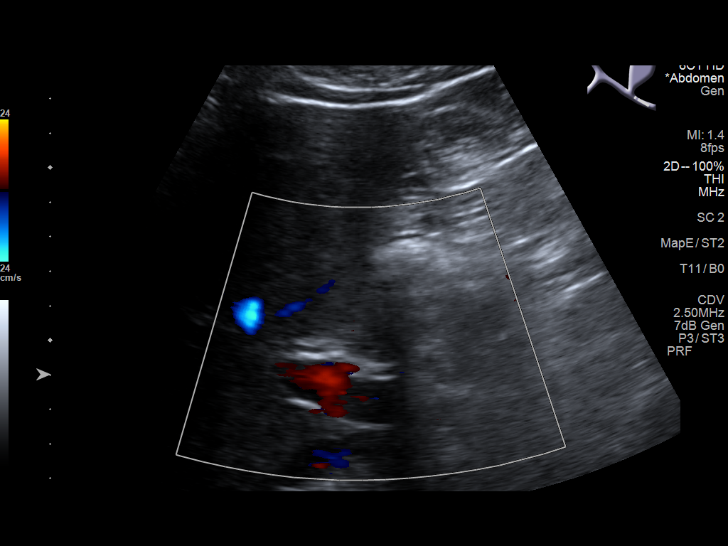
[im 16/42]
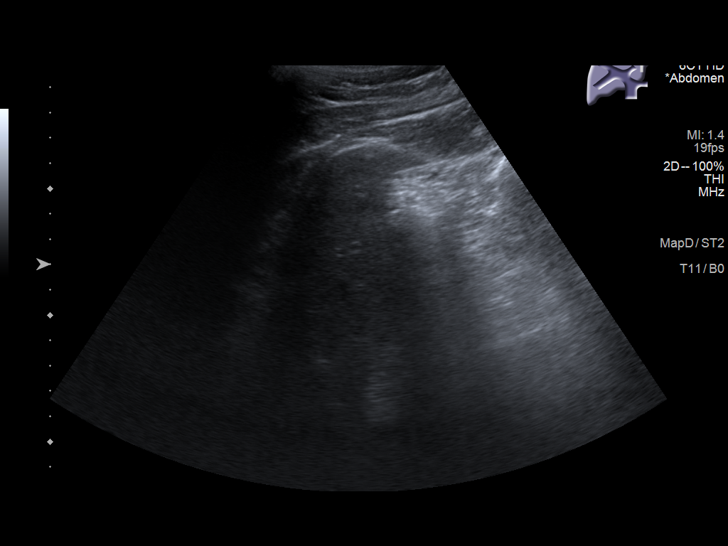
[im 19/42]
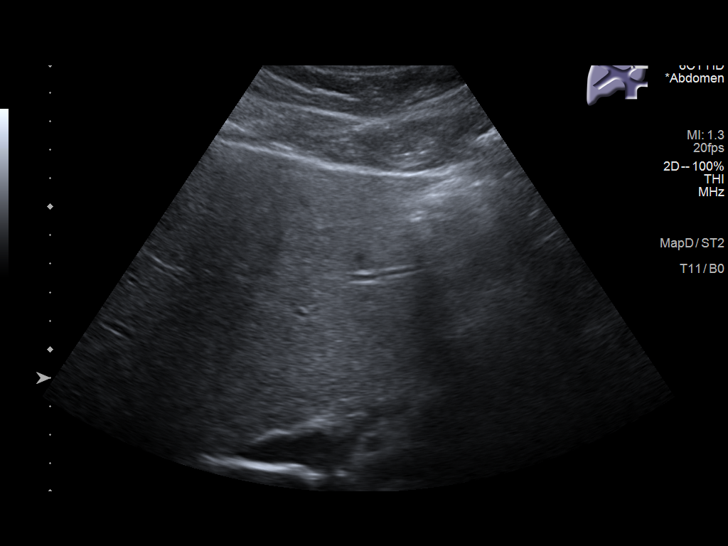
[im 23/42]
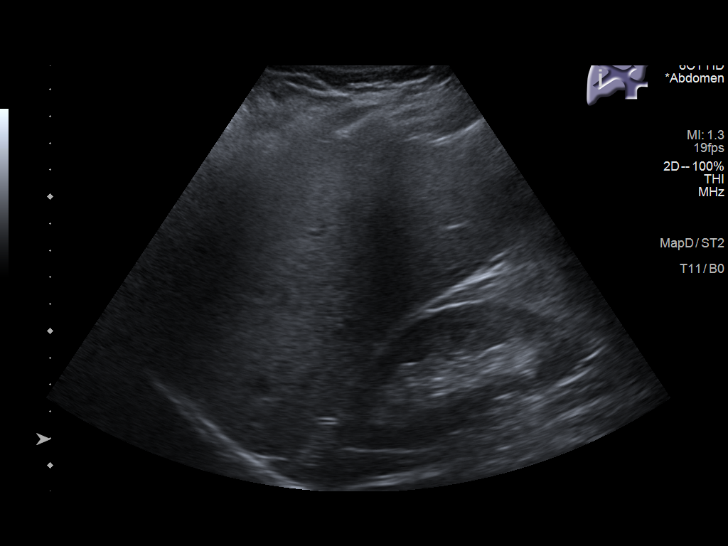
[im 26/42]
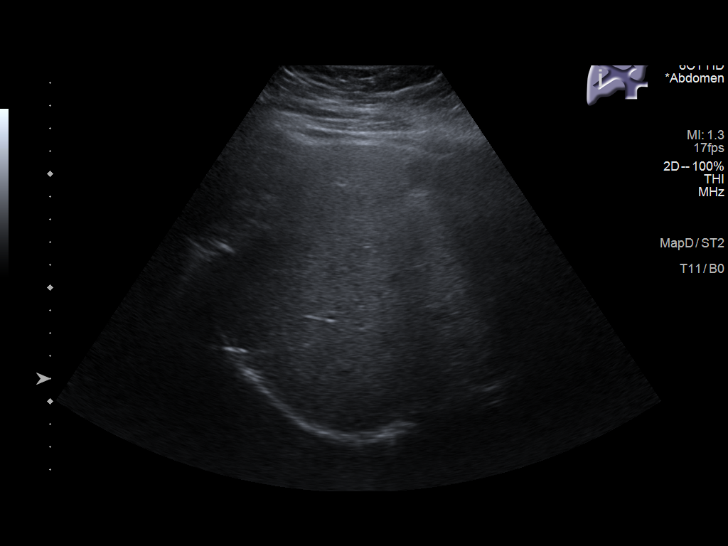
[im 28/42]
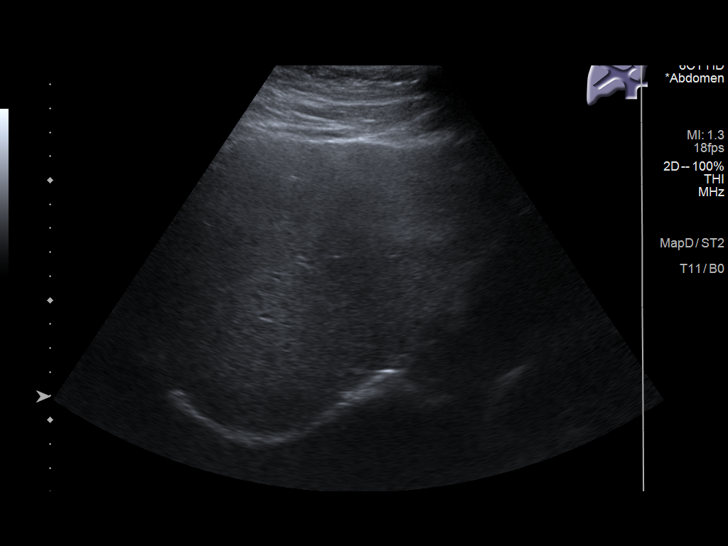
[im 31/42]
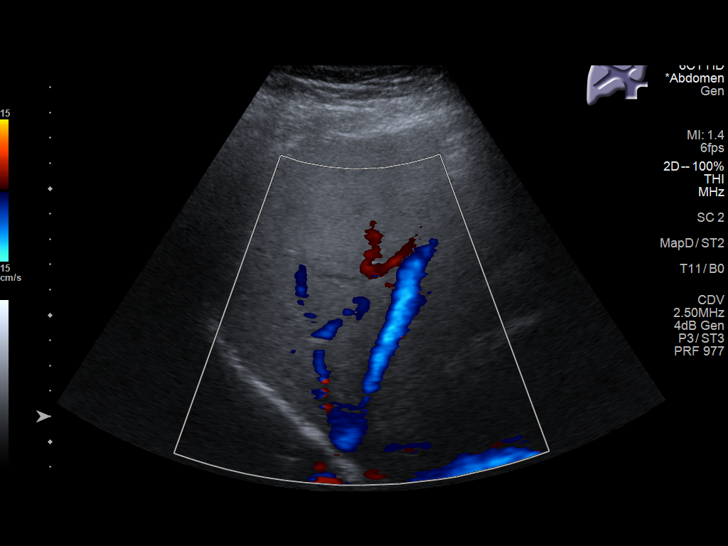
[im 35/42]
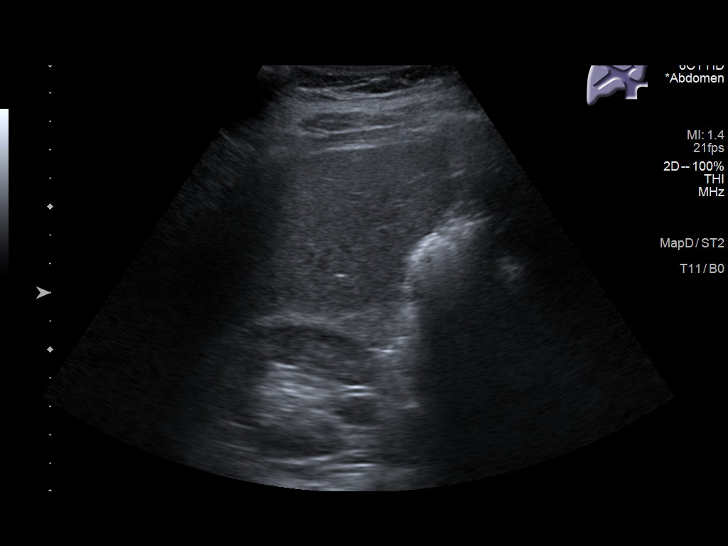
[im 38/42]
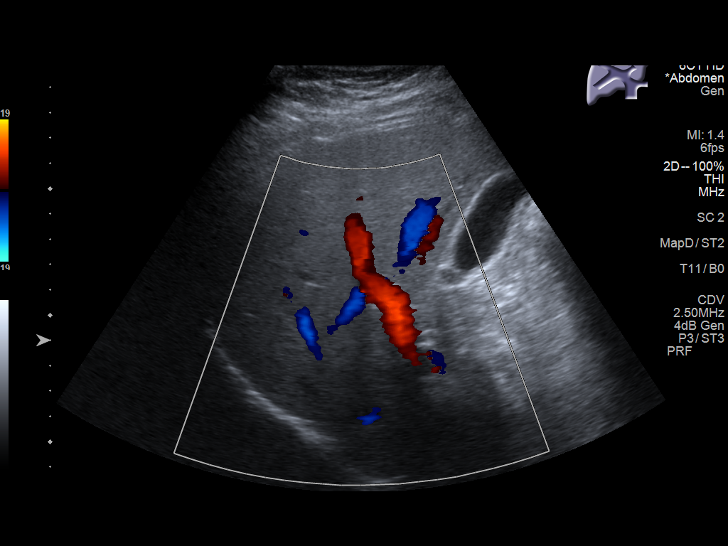
[im 42/42]
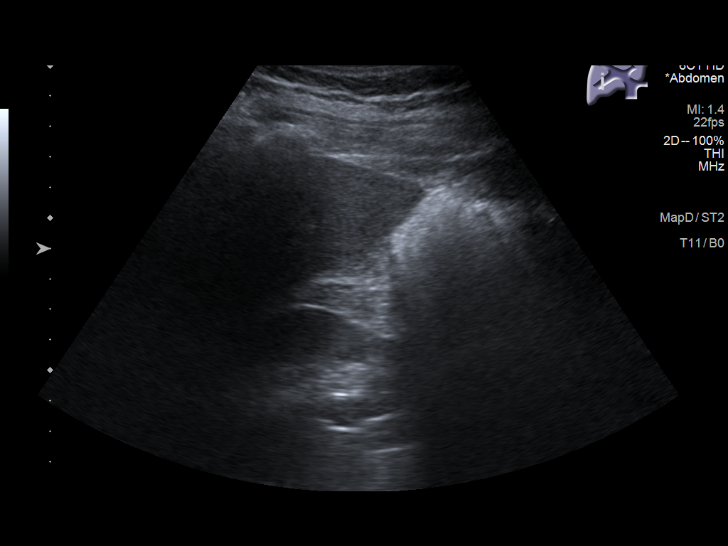

[14 of 25 positions shown; findings below may reference images not displayed]

FINDINGS: Gallbladder:

No gallstones or wall thickening visualized. No sonographic Murphy
sign noted by sonographer.

Common bile duct:

Diameter: 3-4 mm

Liver:

No focal lesion identified. Within normal limits in parenchymal
echogenicity. Portal vein is patent on color Doppler imaging with
normal direction of blood flow towards the liver.

Other: No ascites
IMPRESSION: Normal examination

## 2022-08-11 ENCOUNTER — Encounter: Payer: Self-pay | Admitting: Family Medicine

## 2022-08-12 ENCOUNTER — Telehealth: Payer: Self-pay | Admitting: Family Medicine

## 2022-08-12 NOTE — Telephone Encounter (Signed)
Please check with patient.  We can talk about this at the [place].  Given the timeline, it may be reasonable to consider repeating his u/s since it was done in 10/2020.  That would be instead of proceeding straight to HIDA scan.  Would avoid triggery foods/fatty foods in the meantime.  Thanks.

## 2022-08-12 NOTE — Telephone Encounter (Signed)
I spoke with pt; pt said abd pain began to worsen one day last wk. Pt cannot remember exact day. Upper rt sided abd pain that is dull and comes and goes. When has pain the pain level is 5 - 6. Pt is not having any pain right now. Last time pt had pain was 08/10/22. Pt said he had been eating greasy and fatty foods. Pt does not have a fever. Pt scheduled next appt that was agreeable with pts schedule for 08/15/22 at 8:30 am with Dr Damita Dunnings and [facility] precautions given and pt voiced understanding. Pt was advised Dr Damita Dunnings had previously recommended pt to avoid greasy and fatty foods and pt voiced understanding. Sending note to Dr Damita Dunnings.

## 2022-08-12 NOTE — Telephone Encounter (Signed)
Please triage patient about recent symptoms.  We had prev discussed but I want to make sure about recent symptoms.  Thanks.

## 2022-08-13 NOTE — Telephone Encounter (Signed)
Spoke with patient and discussed below with him. He is okay with this and will discuss further at [place] on 08/15/22.

## 2022-08-15 ENCOUNTER — Encounter: Payer: Self-pay | Admitting: Family Medicine

## 2022-08-15 ENCOUNTER — Ambulatory Visit: Payer: BC Managed Care – PPO | Admitting: Family Medicine

## 2022-08-15 VITALS — BP 104/80 | HR 70 | Temp 97.4°F | Ht 66.0 in | Wt 199.0 lb

## 2022-08-15 DIAGNOSIS — R1011 Right upper quadrant pain: Secondary | ICD-10-CM

## 2022-08-15 LAB — CBC WITH DIFFERENTIAL/PLATELET
Basophils Absolute: 0 10*3/uL (ref 0.0–0.1)
Basophils Relative: 0.4 % (ref 0.0–3.0)
Eosinophils Absolute: 0.2 10*3/uL (ref 0.0–0.7)
Eosinophils Relative: 3.1 % (ref 0.0–5.0)
HCT: 47.3 % (ref 39.0–52.0)
Hemoglobin: 16.1 g/dL (ref 13.0–17.0)
Lymphocytes Relative: 26.4 % (ref 12.0–46.0)
Lymphs Abs: 1.6 10*3/uL (ref 0.7–4.0)
MCHC: 33.9 g/dL (ref 30.0–36.0)
MCV: 87.1 fl (ref 78.0–100.0)
Monocytes Absolute: 0.5 10*3/uL (ref 0.1–1.0)
Monocytes Relative: 8.9 % (ref 3.0–12.0)
Neutro Abs: 3.7 10*3/uL (ref 1.4–7.7)
Neutrophils Relative %: 61.2 % (ref 43.0–77.0)
Platelets: 255 10*3/uL (ref 150.0–400.0)
RBC: 5.43 Mil/uL (ref 4.22–5.81)
RDW: 12.7 % (ref 11.5–15.5)
WBC: 6 10*3/uL (ref 4.0–10.5)

## 2022-08-15 LAB — COMPREHENSIVE METABOLIC PANEL
ALT: 31 U/L (ref 0–53)
AST: 22 U/L (ref 0–37)
Albumin: 4.4 g/dL (ref 3.5–5.2)
Alkaline Phosphatase: 101 U/L (ref 39–117)
BUN: 16 mg/dL (ref 6–23)
CO2: 32 mEq/L (ref 19–32)
Calcium: 9.5 mg/dL (ref 8.4–10.5)
Chloride: 101 mEq/L (ref 96–112)
Creatinine, Ser: 1.08 mg/dL (ref 0.40–1.50)
GFR: 85.66 mL/min (ref >60.00)
Glucose, Bld: 89 mg/dL (ref 70–99)
Potassium: 4.1 mEq/L (ref 3.5–5.1)
Sodium: 139 mEq/L (ref 135–145)
Total Bilirubin: 1 mg/dL (ref 0.2–1.2)
Total Protein: 7 g/dL (ref 6.0–8.3)

## 2022-08-15 LAB — LIPASE: Lipase: 49 U/L (ref 11.0–59.0)

## 2022-08-15 NOTE — Patient Instructions (Signed)
Go to the lab on the way out.   If you have mychart we'll likely use that to update you.    Take care.  Glad to see you. Limit greasy foods.   If labs are fine, then reasonable to get hepatobiliary scan set up.   If abnormal, then likely need to repeat the ultrasound.

## 2022-08-15 NOTE — Progress Notes (Signed)
No regular PPI use.    D/w pt about abd sx.  Had used PPI prev and it helped some.  Diet affected by travel.  Intermittent sx.  Starts on R back, then RUQ.  Then fatigue afterward.  Can wake up with sx or after drinking a lot of water.  No L sided pain.  No burning with urination.  No blood seen in urine.  No FCNAVD.  Discomfort in the RUQ.  Last episode was sig enough to lead to call to clinic.    Alka seltzer helps with the episodes.  Usually better after about 30-60 min after taking alka seltzer.  O/w can last 3-4 hours.    The RUQ sensation is different from midline GERD sx that he occ has.    Prev unremarkable u/s of liver and GB in 10/2020.    He thought is mother had h/o gallbladder symptoms.    Meds, vitals, and allergies reviewed.   ROS: Per HPI unless specifically indicated in ROS section   GEN: nad, alert and oriented HEENT: ncat NECK: supple w/o LA CV: rrr.   PULM: ctab, no inc wob ABD: soft, +bs, nontender to palpation. EXT: no edema SKIN: no acute rash

## 2022-08-18 NOTE — Assessment & Plan Note (Signed)
History of.  See notes on labs. Limit greasy foods.   If labs are fine, then reasonable to get hepatobiliary scan set up.   If abnormal, then likely need to repeat the ultrasound.

## 2022-09-09 ENCOUNTER — Encounter
Admission: RE | Admit: 2022-09-09 | Discharge: 2022-09-09 | Disposition: A | Discharge status: Home / Self Care | Payer: BC Managed Care – PPO | Source: Ambulatory Visit | Attending: Family Medicine | Admitting: Family Medicine

## 2022-09-09 ENCOUNTER — Other Ambulatory Visit: Payer: BC Managed Care – PPO

## 2022-09-09 DIAGNOSIS — R1011 Right upper quadrant pain: Secondary | ICD-10-CM | POA: Diagnosis not present

## 2022-09-09 MED ORDER — TECHNETIUM TC 99M MEBROFENIN IV KIT
5.2200 | PACK | Freq: Once | INTRAVENOUS | Status: AC | PRN
  Administered 2022-09-09: 5.22 via INTRAVENOUS

## 2022-09-13 ENCOUNTER — Telehealth: Payer: Self-pay | Admitting: Family Medicine

## 2022-09-13 NOTE — Telephone Encounter (Signed)
Patient returned a call regarding a test he had performed. Would like a cb.

## 2022-09-13 NOTE — Telephone Encounter (Signed)
See result note for further documentation.

## 2022-09-16 ENCOUNTER — Ambulatory Visit: Payer: Managed Care, Other (non HMO) | Admitting: Dermatology

## 2022-09-19 ENCOUNTER — Encounter: Payer: Self-pay | Admitting: Family Medicine

## 2023-04-17 ENCOUNTER — Encounter (INDEPENDENT_AMBULATORY_CARE_PROVIDER_SITE_OTHER): Payer: Self-pay

## 2023-04-30 ENCOUNTER — Other Ambulatory Visit: Payer: Self-pay | Admitting: Family Medicine

## 2023-04-30 DIAGNOSIS — E785 Hyperlipidemia, unspecified: Secondary | ICD-10-CM

## 2023-05-02 ENCOUNTER — Other Ambulatory Visit (INDEPENDENT_AMBULATORY_CARE_PROVIDER_SITE_OTHER): Payer: BC Managed Care – PPO

## 2023-05-02 DIAGNOSIS — E785 Hyperlipidemia, unspecified: Secondary | ICD-10-CM | POA: Diagnosis not present

## 2023-05-02 LAB — COMPREHENSIVE METABOLIC PANEL
ALT: 28 U/L (ref 0–53)
AST: 27 U/L (ref 0–37)
Albumin: 4.3 g/dL (ref 3.5–5.2)
Alkaline Phosphatase: 87 U/L (ref 39–117)
BUN: 16 mg/dL (ref 6–23)
CO2: 27 mEq/L (ref 19–32)
Calcium: 9.4 mg/dL (ref 8.4–10.5)
Chloride: 104 mEq/L (ref 96–112)
Creatinine, Ser: 1.02 mg/dL (ref 0.40–1.50)
GFR: 91.29 mL/min (ref >60.00)
Glucose, Bld: 82 mg/dL (ref 70–99)
Potassium: 4 mEq/L (ref 3.5–5.1)
Sodium: 138 mEq/L (ref 135–145)
Total Bilirubin: 1 mg/dL (ref 0.2–1.2)
Total Protein: 7.1 g/dL (ref 6.0–8.3)

## 2023-05-02 LAB — LIPID PANEL
Cholesterol: 195 mg/dL (ref 0–200)
HDL: 39 mg/dL — ABNORMAL LOW (ref >39.00)
LDL Cholesterol: 134 mg/dL — ABNORMAL HIGH (ref 0–99)
NonHDL: 156.04
Total CHOL/HDL Ratio: 5
Triglycerides: 111 mg/dL (ref 0.0–149.0)
VLDL: 22.2 mg/dL (ref 0.0–40.0)

## 2023-05-09 ENCOUNTER — Encounter: Payer: Self-pay | Admitting: Family Medicine

## 2023-05-09 ENCOUNTER — Ambulatory Visit (INDEPENDENT_AMBULATORY_CARE_PROVIDER_SITE_OTHER): Payer: BC Managed Care – PPO | Admitting: Family Medicine

## 2023-05-09 VITALS — BP 110/78 | HR 78 | Temp 97.3°F | Ht 65.25 in | Wt 191.0 lb

## 2023-05-09 DIAGNOSIS — Z0000 Encounter for general adult medical examination without abnormal findings: Secondary | ICD-10-CM

## 2023-05-09 DIAGNOSIS — R1011 Right upper quadrant pain: Secondary | ICD-10-CM

## 2023-05-09 DIAGNOSIS — Z7189 Other specified counseling: Secondary | ICD-10-CM

## 2023-05-09 NOTE — Assessment & Plan Note (Signed)
Tetanus 2023 ?Flu encouraged.  Discussed. ?Covid prev done.   ?[facility]CV [facility] ?PNA and shingles not due ?Colon and prostate cancer screening not due. ?Living will d/w pt.  Wife designated if patient were incapacitated.   ?Diet and exercise d/w pt.  ?

## 2023-05-09 NOTE — Progress Notes (Signed)
CPE- See plan.  Routine anticipatory guidance given to patient.  See health maintenance.  The possibility exists that previously documented standard health maintenance information may have been brought forward from a previous encounter into this note.  If needed, that same information has been updated to reflect the current situation based on today's encounter.    Tetanus 2023 Flu encouraged.  Discussed. Covid prev done.   [facility] PNA and shingles not due Colon and prostate cancer screening not due. Living will d/w pt.  Wife designated if patient were incapacitated.   Diet and exercise d/w pt.   Allergy cautions d/w pt, shellfish.  He avoids triggers.    Still with episodic RUQ discomfort, noted with periods of higher stress.  Happening rarely, ie ever few months.  D/w pt about options.  Observation would be reasonable.  No jaundice. No vomiting. No black or bloody stools. He'll update me as needed.  Taking omeprazole about 1 week per month, if he has schedule disruption related to work.    PMH and SH reviewed  Meds, vitals, and allergies reviewed.   ROS: Per HPI.  Unless specifically indicated otherwise in HPI, the patient denies:  General: fever. Eyes: acute vision changes ENT: sore throat Cardiovascular: chest pain Respiratory: SOB GI: vomiting GU: dysuria Musculoskeletal: acute back pain Derm: acute rash Neuro: acute motor dysfunction Psych: worsening mood Endocrine: polydipsia Heme: bleeding Allergy: hayfever  GEN: nad, alert and oriented HEENT: mucous membranes moist NECK: supple w/o LA CV: rrr. PULM: ctab, no inc wob ABD: soft, +bs, not ttp x4 quadrants.  EXT: no edema SKIN: no acute rash  The 10-year ASCVD risk score (Arnett DK, et al., 2019) is: 1.3%   Values used to calculate the score:     Age: 42 years     Sex: Male     Is Non-Hispanic African American: No     Diabetic: No     Tobacco smoker: No     Systolic Blood  Pressure: 110 mmHg     Is BP treated: No     HDL Cholesterol: 39 mg/dL     Total Cholesterol: 195 mg/dL  Labs d/w pt.  Low ASCVD risk.  D/w pt.

## 2023-05-09 NOTE — Assessment & Plan Note (Signed)
Living will d/w pt.  Wife designated if patient were incapacitated.   ?

## 2023-05-09 NOTE — Patient Instructions (Addendum)
Call dermatology when possible.   [phone]. Update me as needed.  I would get a flu shot each fall.   Take care.  Glad to see you.

## 2023-05-09 NOTE — Assessment & Plan Note (Signed)
No pain today. He'll update me as needed.  Taking omeprazole about 1 week per month, if he has schedule disruption related to work.

## 2024-05-18 ENCOUNTER — Encounter: Admitting: Family Medicine

## 2024-06-07 ENCOUNTER — Other Ambulatory Visit: Payer: Self-pay | Admitting: Family Medicine

## 2024-06-07 DIAGNOSIS — E785 Hyperlipidemia, unspecified: Secondary | ICD-10-CM

## 2024-06-15 ENCOUNTER — Ambulatory Visit: Payer: Self-pay | Admitting: Family Medicine

## 2024-06-15 ENCOUNTER — Other Ambulatory Visit

## 2024-06-15 DIAGNOSIS — E785 Hyperlipidemia, unspecified: Secondary | ICD-10-CM

## 2024-06-15 LAB — COMPREHENSIVE METABOLIC PANEL WITH GFR
ALT: 23 U/L (ref 0–53)
AST: 24 U/L (ref 0–37)
Albumin: 4.2 g/dL (ref 3.5–5.2)
Alkaline Phosphatase: 84 U/L (ref 39–117)
BUN: 15 mg/dL (ref 6–23)
CO2: 31 meq/L (ref 19–32)
Calcium: 9 mg/dL (ref 8.4–10.5)
Chloride: 102 meq/L (ref 96–112)
Creatinine, Ser: 1.05 mg/dL (ref 0.40–1.50)
GFR: 87.47 mL/min (ref >60.00)
Glucose, Bld: 92 mg/dL (ref 70–99)
Potassium: 3.7 meq/L (ref 3.5–5.1)
Sodium: 139 meq/L (ref 135–145)
Total Bilirubin: 0.9 mg/dL (ref 0.2–1.2)
Total Protein: 6.8 g/dL (ref 6.0–8.3)

## 2024-06-15 LAB — LIPID PANEL
Cholesterol: 193 mg/dL (ref 0–200)
HDL: 42.1 mg/dL (ref >39.00)
LDL Cholesterol: 129 mg/dL — ABNORMAL HIGH (ref 0–99)
NonHDL: 151
Total CHOL/HDL Ratio: 5
Triglycerides: 112 mg/dL (ref 0.0–149.0)
VLDL: 22.4 mg/dL (ref 0.0–40.0)

## 2024-06-22 ENCOUNTER — Encounter: Payer: Self-pay | Admitting: Family Medicine

## 2024-06-22 ENCOUNTER — Ambulatory Visit (INDEPENDENT_AMBULATORY_CARE_PROVIDER_SITE_OTHER): Admitting: Family Medicine

## 2024-06-22 VITALS — BP 118/82 | HR 96 | Temp 98.4°F | Ht 65.0 in | Wt 185.0 lb

## 2024-06-22 DIAGNOSIS — Z0000 Encounter for general adult medical examination without abnormal findings: Secondary | ICD-10-CM | POA: Diagnosis not present

## 2024-06-22 DIAGNOSIS — Z7189 Other specified counseling: Secondary | ICD-10-CM

## 2024-06-22 NOTE — Patient Instructions (Signed)
Thanks for your effort.  Take care.  Glad to see you. Update me as needed.  I would get a flu shot each fall.

## 2024-06-22 NOTE — Progress Notes (Unsigned)
 CPE- See plan.  Routine anticipatory guidance given to patient.  See health maintenance.  The possibility exists that previously documented standard health maintenance information may have been brought forward from a previous encounter into this note.  If needed, that same information has been updated to reflect the current situation based on today's encounter.    Tetanus 2023 Flu encouraged.  Discussed. Covid prev done.   [facility]CV [facility] PNA and shingles not due Colon and prostate cancer screening not due. Living will d/w pt.  Wife designated if patient were incapacitated.   Diet and exercise d/w pt.   Abd sx improved, he is working on diet, not taking PPI often.    His son got married this year.    PMH and SH reviewed  Meds, vitals, and allergies reviewed.   ROS: Per HPI.  Unless specifically indicated otherwise in HPI, the patient denies:  General: fever. Eyes: acute vision changes ENT: sore throat Cardiovascular: chest pain Respiratory: SOB GI: vomiting GU: dysuria Musculoskeletal: acute back pain Derm: acute rash Neuro: acute motor dysfunction Psych: worsening mood Endocrine: polydipsia Heme: bleeding Allergy: hayfever  GEN: nad, alert and oriented HEENT: mucous membranes moist NECK: supple w/o LA CV: rrr. PULM: ctab, no inc wob ABD: soft, +bs EXT: no edema SKIN: no acute rash  The 10-year ASCVD risk score (Arnett DK, et al., 2019) is: 1.5%   Values used to calculate the score:     Age: 43 years     Clincally relevant sex: Male     Is Non-Hispanic African American: No     Diabetic: No     Tobacco smoker: No     Systolic Blood Pressure: 118 mmHg     Is BP treated: No     HDL Cholesterol: 42.1 mg/dL     Total Cholesterol: 193 mg/dL

## 2024-06-23 NOTE — Assessment & Plan Note (Signed)
 Living will d/w pt.  Wife designated if patient were incapacitated.   ?

## 2024-06-23 NOTE — Assessment & Plan Note (Signed)
 Tetanus 2023 Flu encouraged.  Discussed. Covid prev done.   [facility]CV [facility] ~[facility] PNA and shingles not due Colon and prostate cancer screening not due. Living will d/w pt.  Wife designated if patient were incapacitated.   Diet and exercise d/w pt.   Labs discussed with patient.
